# Patient Record
Sex: Male | Born: 2000
Health system: Southern US, Community
[De-identification: ages and names within clinical notes are randomized; demographics above are authoritative.]

## PROBLEM LIST (undated history)

## (undated) DIAGNOSIS — T7840XA Allergy, unspecified, initial encounter: Secondary | ICD-10-CM

## (undated) DIAGNOSIS — M419 Scoliosis, unspecified: Secondary | ICD-10-CM

## (undated) HISTORY — DX: Allergy, unspecified, initial encounter: T78.40XA

## (undated) HISTORY — DX: Scoliosis, unspecified: M41.9

---
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
## 2022-11-04 ENCOUNTER — Encounter: Payer: 59 | Admitting: Family Medicine
